# Patient Record
Sex: Male | Born: 1965 | Race: Black or African American | Hispanic: No | Marital: Married | Smoking: Never smoker
Health system: Southern US, Community
[De-identification: ages and names within clinical notes are randomized; demographics above are authoritative.]

## PROBLEM LIST (undated history)

## (undated) HISTORY — PX: HERNIA REPAIR: SHX51

---

## 2020-11-19 ENCOUNTER — Encounter (HOSPITAL_BASED_OUTPATIENT_CLINIC_OR_DEPARTMENT_OTHER): Payer: Self-pay | Admitting: Urology

## 2020-11-19 ENCOUNTER — Emergency Department (HOSPITAL_BASED_OUTPATIENT_CLINIC_OR_DEPARTMENT_OTHER)
Admission: EM | Admit: 2020-11-19 | Discharge: 2020-11-19 | Disposition: A | Discharge status: Home / Self Care | Payer: BC Managed Care – PPO | Attending: Emergency Medicine | Admitting: Emergency Medicine

## 2020-11-19 ENCOUNTER — Other Ambulatory Visit: Payer: Self-pay

## 2020-11-19 ENCOUNTER — Emergency Department (HOSPITAL_BASED_OUTPATIENT_CLINIC_OR_DEPARTMENT_OTHER): Payer: BC Managed Care – PPO

## 2020-11-19 DIAGNOSIS — Y92410 Unspecified street and highway as the place of occurrence of the external cause: Secondary | ICD-10-CM | POA: Insufficient documentation

## 2020-11-19 DIAGNOSIS — S5012XA Contusion of left forearm, initial encounter: Secondary | ICD-10-CM | POA: Insufficient documentation

## 2020-11-19 DIAGNOSIS — S199XXA Unspecified injury of neck, initial encounter: Secondary | ICD-10-CM | POA: Diagnosis present

## 2020-11-19 DIAGNOSIS — M79632 Pain in left forearm: Secondary | ICD-10-CM | POA: Diagnosis not present

## 2020-11-19 DIAGNOSIS — R519 Headache, unspecified: Secondary | ICD-10-CM | POA: Insufficient documentation

## 2020-11-19 DIAGNOSIS — S161XXA Strain of muscle, fascia and tendon at neck level, initial encounter: Secondary | ICD-10-CM | POA: Insufficient documentation

## 2020-11-19 MED ORDER — CYCLOBENZAPRINE HCL 10 MG PO TABS: 10.0000 mg | ORAL_TABLET | Freq: Every evening | ORAL | 0 refills | Status: AC | PRN
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
## 2020-11-19 NOTE — Discharge Instructions (Addendum)
I recommend a combination of tylenol and ibuprofen for management of your pain. You can take a low dose of both at the same time. I recommend 500 mg of Tylenol combined with 600 mg of ibuprofen. This is one maximum strength Tylenol and three regular ibuprofen. You can take these 2-3 times for day for your pain. Please try to take these medications with a small amount of food as well to prevent upsetting your stomach.  Also, please consider topical pain relieving creams such as Voltaran Gel, BioFreeze, or Icy Hot. There is also a pain relieving cream made by [employer]. You should be able to find all of these at your local pharmacy.   I am prescribing you a strong muscle relaxer called flexeril. Please only take this medication once in the evening with dinner. This medication can make you quite drowsy. Do not mix it with alcohol. Do not drive a vehicle after taking it.   Please return to the emergency department with any new or worsening symptoms.  It was a pleasure to meet you.
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
## 2024-02-05 ENCOUNTER — Other Ambulatory Visit: Payer: Self-pay | Admitting: Medical Genetics

## 2024-03-13 ENCOUNTER — Other Ambulatory Visit

## 2024-03-31 ENCOUNTER — Other Ambulatory Visit
Admission: RE | Admit: 2024-03-31 | Discharge: 2024-03-31 | Disposition: A | Discharge status: Home / Self Care | Payer: Self-pay | Source: Ambulatory Visit | Attending: Medical Genetics | Admitting: Medical Genetics

## 2024-04-18 LAB — GENECONNECT MOLECULAR SCREEN: Genetic Analysis Overall Interpretation: NEGATIVE
